# Patient Record
Sex: Male | Born: 1995 | Race: Black or African American | Hispanic: No | Marital: Single | Smoking: Current every day smoker
Health system: Southern US, Community
[De-identification: ages and names within clinical notes are randomized; demographics above are authoritative.]

---
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
## 2014-03-21 ENCOUNTER — Emergency Department: Payer: Self-pay | Admitting: Student

## 2014-08-01 ENCOUNTER — Emergency Department: Admit: 2014-08-01 | Disposition: A | Discharge status: Home / Self Care | Payer: Self-pay | Admitting: Student

## 2014-08-22 ENCOUNTER — Emergency Department
Admit: 2014-08-22 | Disposition: A | Discharge status: Home / Self Care | Payer: Self-pay | Admitting: Emergency Medicine

## 2017-03-14 ENCOUNTER — Emergency Department
Admission: EM | Admit: 2017-03-14 | Discharge: 2017-03-14 | Disposition: A | Discharge status: Other Acute Inpatient Hospital | Payer: Commercial Managed Care - PPO | Attending: Student in an Organized Health Care Education/Training Program | Admitting: Student in an Organized Health Care Education/Training Program

## 2017-03-14 ENCOUNTER — Encounter: Payer: Self-pay | Admitting: Emergency Medicine

## 2017-03-14 ENCOUNTER — Emergency Department: Payer: Commercial Managed Care - PPO

## 2017-03-14 DIAGNOSIS — Z23 Encounter for immunization: Secondary | ICD-10-CM | POA: Insufficient documentation

## 2017-03-14 DIAGNOSIS — S66902A Unspecified injury of unspecified muscle, fascia and tendon at wrist and hand level, left hand, initial encounter: Secondary | ICD-10-CM | POA: Insufficient documentation

## 2017-03-14 DIAGNOSIS — S61402A Unspecified open wound of left hand, initial encounter: Secondary | ICD-10-CM | POA: Insufficient documentation

## 2017-03-14 DIAGNOSIS — R079 Chest pain, unspecified: Secondary | ICD-10-CM | POA: Diagnosis not present

## 2017-03-14 DIAGNOSIS — Y998 Other external cause status: Secondary | ICD-10-CM | POA: Insufficient documentation

## 2017-03-14 DIAGNOSIS — S3991XA Unspecified injury of abdomen, initial encounter: Secondary | ICD-10-CM | POA: Insufficient documentation

## 2017-03-14 DIAGNOSIS — F17210 Nicotine dependence, cigarettes, uncomplicated: Secondary | ICD-10-CM | POA: Diagnosis not present

## 2017-03-14 DIAGNOSIS — Y939 Activity, unspecified: Secondary | ICD-10-CM | POA: Diagnosis not present

## 2017-03-14 DIAGNOSIS — R55 Syncope and collapse: Secondary | ICD-10-CM | POA: Insufficient documentation

## 2017-03-14 DIAGNOSIS — S62323B Displaced fracture of shaft of third metacarpal bone, left hand, initial encounter for open fracture: Secondary | ICD-10-CM | POA: Diagnosis not present

## 2017-03-14 DIAGNOSIS — Y92410 Unspecified street and highway as the place of occurrence of the external cause: Secondary | ICD-10-CM | POA: Diagnosis not present

## 2017-03-14 DIAGNOSIS — R000 Tachycardia, unspecified: Secondary | ICD-10-CM | POA: Insufficient documentation

## 2017-03-14 DIAGNOSIS — S0993XA Unspecified injury of face, initial encounter: Secondary | ICD-10-CM | POA: Diagnosis present

## 2017-03-14 LAB — CBC WITH DIFFERENTIAL/PLATELET
BASOS ABS: 0 10*3/uL (ref 0–0.1)
Basophils Relative: 0 %
EOS PCT: 0 %
Eosinophils Absolute: 0 10*3/uL (ref 0–0.7)
HEMATOCRIT: 42.2 % (ref 40.0–52.0)
Hemoglobin: 13.2 g/dL (ref 13.0–18.0)
LYMPHS PCT: 5 %
Lymphs Abs: 0.7 10*3/uL — ABNORMAL LOW (ref 1.0–3.6)
MCH: 22.5 pg — ABNORMAL LOW (ref 26.0–34.0)
MCHC: 31.2 g/dL — ABNORMAL LOW (ref 32.0–36.0)
MCV: 72.1 fL — AB (ref 80.0–100.0)
Monocytes Absolute: 1.1 10*3/uL — ABNORMAL HIGH (ref 0.2–1.0)
Monocytes Relative: 8 %
NEUTROS ABS: 12.5 10*3/uL — AB (ref 1.4–6.5)
Neutrophils Relative %: 87 %
PLATELETS: 223 10*3/uL (ref 150–440)
RBC: 5.84 MIL/uL (ref 4.40–5.90)
RDW: 15.4 % — ABNORMAL HIGH (ref 11.5–14.5)
WBC: 14.3 10*3/uL — AB (ref 3.8–10.6)

## 2017-03-14 LAB — COMPREHENSIVE METABOLIC PANEL
ALT: 23 U/L (ref 17–63)
AST: 33 U/L (ref 15–41)
Albumin: 4.3 g/dL (ref 3.5–5.0)
Alkaline Phosphatase: 53 U/L (ref 38–126)
Anion gap: 9 (ref 5–15)
BUN: 11 mg/dL (ref 6–20)
CALCIUM: 8.6 mg/dL — AB (ref 8.9–10.3)
CHLORIDE: 104 mmol/L (ref 101–111)
CO2: 24 mmol/L (ref 22–32)
CREATININE: 0.86 mg/dL (ref 0.61–1.24)
GFR calc Af Amer: >60 mL/min (ref >60)
Glucose, Bld: 100 mg/dL — ABNORMAL HIGH (ref 65–99)
Potassium: 3.6 mmol/L (ref 3.5–5.1)
Sodium: 137 mmol/L (ref 135–145)
TOTAL PROTEIN: 7 g/dL (ref 6.5–8.1)
Total Bilirubin: 0.5 mg/dL (ref 0.3–1.2)

## 2017-03-14 LAB — LIPASE, BLOOD: Lipase: 20 U/L (ref 11–51)

## 2017-03-14 LAB — TYPE AND SCREEN
ABO/RH(D): O POS
Antibody Screen: NEGATIVE

## 2017-03-14 MED ORDER — CEFAZOLIN SODIUM-DEXTROSE 1-4 GM/50ML-% IV SOLN
1.0000 g | Freq: Once | INTRAVENOUS | Status: AC
  Administered 2017-03-14: 1 g via INTRAVENOUS

## 2017-03-14 MED ORDER — TETANUS-DIPHTH-ACELL PERTUSSIS 5-2.5-18.5 LF-MCG/0.5 IM SUSP
0.5000 mL | Freq: Once | INTRAMUSCULAR | Status: AC
  Administered 2017-03-14: 0.5 mL via INTRAMUSCULAR
  Filled 2017-03-14: qty 0.5

## 2017-03-14 MED ORDER — CEFAZOLIN SODIUM 1 G IJ SOLR
INTRAMUSCULAR | Status: DC
Start: 2017-03-14 — End: 2017-03-14
  Filled 2017-03-14: qty 10

## 2017-03-14 MED ORDER — IOPAMIDOL (ISOVUE-300) INJECTION 61%
100.0000 mL | Freq: Once | INTRAVENOUS | Status: AC | PRN
  Administered 2017-03-14: 100 mL via INTRAVENOUS

## 2017-03-14 MED ORDER — SODIUM CHLORIDE 0.9 % IV BOLUS (SEPSIS)
1000.0000 mL | Freq: Once | INTRAVENOUS | Status: AC
  Administered 2017-03-14: 1000 mL via INTRAVENOUS

## 2017-03-14 MED ORDER — FENTANYL CITRATE (PF) 100 MCG/2ML IJ SOLN
100.0000 ug | INTRAMUSCULAR | Status: DC | PRN
  Administered 2017-03-14: 100 ug via INTRAVENOUS
  Filled 2017-03-14: qty 2
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
## 2017-03-14 NOTE — ED Notes (Signed)
Hand splinted by Tammy per dr orders.
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
## 2017-03-14 NOTE — ED Notes (Signed)
Gave report to christy from [facility]
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
## 2021-12-25 ENCOUNTER — Ambulatory Visit: Payer: Self-pay

## 2021-12-28 ENCOUNTER — Ambulatory Visit: Payer: Self-pay

## 2022-02-16 ENCOUNTER — Emergency Department: Payer: No Typology Code available for payment source

## 2022-02-16 ENCOUNTER — Other Ambulatory Visit: Payer: Self-pay

## 2022-02-16 DIAGNOSIS — R079 Chest pain, unspecified: Secondary | ICD-10-CM | POA: Diagnosis not present

## 2022-02-16 DIAGNOSIS — S62101A Fracture of unspecified carpal bone, right wrist, initial encounter for closed fracture: Secondary | ICD-10-CM | POA: Diagnosis not present

## 2022-02-16 DIAGNOSIS — Y92410 Unspecified street and highway as the place of occurrence of the external cause: Secondary | ICD-10-CM | POA: Diagnosis not present

## 2022-02-16 DIAGNOSIS — M25512 Pain in left shoulder: Secondary | ICD-10-CM | POA: Insufficient documentation

## 2022-02-16 DIAGNOSIS — S6991XA Unspecified injury of right wrist, hand and finger(s), initial encounter: Secondary | ICD-10-CM | POA: Diagnosis present
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
## 2022-02-17 ENCOUNTER — Emergency Department
Admission: EM | Admit: 2022-02-17 | Discharge: 2022-02-17 | Disposition: A | Discharge status: Home / Self Care | Payer: No Typology Code available for payment source | Attending: Emergency Medicine | Admitting: Emergency Medicine

## 2022-02-17 DIAGNOSIS — S62101A Fracture of unspecified carpal bone, right wrist, initial encounter for closed fracture: Secondary | ICD-10-CM

## 2022-02-17 MED ORDER — OXYCODONE HCL 5 MG PO TABS
5.0000 mg | ORAL_TABLET | Freq: Once | ORAL | Status: AC
  Administered 2022-02-17: 5 mg via ORAL
  Filled 2022-02-17: qty 1

## 2022-02-17 MED ORDER — OXYCODONE HCL 5 MG PO TABS: 5.0000 mg | ORAL_TABLET | Freq: Three times a day (TID) | ORAL | 0 refills | Status: AC | PRN

## 2022-02-17 MED ORDER — ACETAMINOPHEN 500 MG PO TABS
1000.0000 mg | ORAL_TABLET | Freq: Once | ORAL | Status: AC
  Administered 2022-02-17: 1000 mg via ORAL
  Filled 2022-02-17: qty 2

## 2022-02-17 MED ORDER — IBUPROFEN 600 MG PO TABS
600.0000 mg | ORAL_TABLET | Freq: Once | ORAL | Status: AC
  Administered 2022-02-17: 600 mg via ORAL
  Filled 2022-02-17: qty 1
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
## 2022-02-17 NOTE — ED Notes (Signed)
Patient discharged at this time. Ambulated to lobby with independent and steady gait. Breathing unlabored speaking in full sentences. Verbalized understanding of all discharge, follow up, and medication teaching. Discharged homed with all belongings.   

## 2022-02-17 NOTE — Discharge Instructions (Addendum)
You fractured a "sesamoid bone" at the base of your thumb.  This is like an accessory bone that helps act as a pulley for ligaments to go across to help your joints move.  It is not the actual hand or thumb bone itself, it is a small adjacent bone by the joint where your thumb meets your hand.  Use ice, elevation, compression with the brace.  Follow up with the orthopedic doctor in the clinic  Please take Tylenol and ibuprofen/Advil for your pain.  It is safe to take them together, or to alternate them every few hours.  Take up to 1000mg  of Tylenol at a time, up to 4 times per day.  Do not take more than 4000 mg of Tylenol in 24 hours.  For ibuprofen, take 400-600 mg, 3 - 4 times per day.  Use the oxycodone as needed for more severe/breakthrough pain.

## 2022-02-19 ENCOUNTER — Emergency Department
Admission: EM | Admit: 2022-02-19 | Discharge: 2022-02-19 | Discharge status: Against Medical Advice | Payer: Commercial Managed Care - PPO

## 2023-04-27 ENCOUNTER — Encounter (HOSPITAL_BASED_OUTPATIENT_CLINIC_OR_DEPARTMENT_OTHER): Payer: Self-pay | Admitting: Emergency Medicine

## 2023-04-27 ENCOUNTER — Other Ambulatory Visit: Payer: Self-pay

## 2023-04-27 ENCOUNTER — Emergency Department (HOSPITAL_BASED_OUTPATIENT_CLINIC_OR_DEPARTMENT_OTHER)
Admission: EM | Admit: 2023-04-27 | Discharge: 2023-04-27 | Disposition: A | Discharge status: Home / Self Care | Payer: Self-pay

## 2023-04-27 ENCOUNTER — Emergency Department (HOSPITAL_BASED_OUTPATIENT_CLINIC_OR_DEPARTMENT_OTHER): Payer: Self-pay | Admitting: Radiology

## 2023-04-27 DIAGNOSIS — T148XXA Other injury of unspecified body region, initial encounter: Secondary | ICD-10-CM

## 2023-04-27 DIAGNOSIS — X501XXA Overexertion from prolonged static or awkward postures, initial encounter: Secondary | ICD-10-CM | POA: Insufficient documentation

## 2023-04-27 DIAGNOSIS — Y9367 Activity, basketball: Secondary | ICD-10-CM | POA: Insufficient documentation

## 2023-04-27 DIAGNOSIS — S82891A Other fracture of right lower leg, initial encounter for closed fracture: Secondary | ICD-10-CM | POA: Insufficient documentation

## 2023-04-27 MED ORDER — OXYCODONE-ACETAMINOPHEN 5-325 MG PO TABS
1.0000 | ORAL_TABLET | ORAL | Status: DC | PRN
Start: 2023-04-27 — End: 2023-04-28
  Administered 2023-04-27: 1 via ORAL
  Filled 2023-04-27: qty 1

## 2023-04-27 MED ORDER — HYDROCODONE-ACETAMINOPHEN 5-325 MG PO TABS: 2.0000 | ORAL_TABLET | Freq: Four times a day (QID) | ORAL | 0 refills | Status: AC | PRN

## 2023-04-27 NOTE — ED Triage Notes (Signed)
Twisted right ankle today

## 2023-04-27 NOTE — ED Provider Notes (Signed)
[place] EMERGENCY DEPARTMENT AT [facility] Provider Note   CSN: [id number] Arrival date & time: 04/27/23  1446     History {Add pertinent medical, surgical, social history, OB history to HPI:1} Chief Complaint  Patient presents with   Ankle Pain    LAIKYN DAMAN is a 27 y.o. male.   Ankle Pain      Home Medications Prior to Admission medications   Not on File      Allergies    Patient has no known allergies.    Review of Systems   Review of Systems  Physical Exam Updated Vital Signs BP 132/75 (BP Location: Left Arm)   Pulse 91   Temp 99.2 F (37.3 C)   Resp 18   Wt 61.2 kg   SpO2 100%   BMI 20.53 kg/m  Physical Exam  ED Results / Procedures / Treatments   Labs (all labs ordered are listed, but only abnormal results are displayed) Labs Reviewed - No data to display  EKG None  Radiology DG Ankle Complete Right Result Date: 04/27/2023 CLINICAL DATA:  Twisting injury to the ankle while playing basketball EXAM: RIGHT ANKLE - COMPLETE 3 VIEW COMPARISON:  Right ankle radiograph dated 10/23/2006 FINDINGS: Curvilinear radiodensity projecting over the distal lateral calcaneus. No joint effusion. There is no evidence of arthropathy or other focal bone abnormality. No definite widening of the ankle mortise, noting somewhat suboptimal positioning on mortise view. Soft tissue swelling overlying the lateral hindfoot. IMPRESSION: 1. Curvilinear radiodensity projecting over the distal lateral calcaneus, suspicious for a small avulsion fracture. 2. Soft tissue swelling overlying the lateral hindfoot. Electronically Signed   By: Agustin Cree M.D.   On: 04/27/2023 16:57    Procedures Procedures  {Document cardiac monitor, telemetry assessment procedure when appropriate:1}  Medications Ordered in ED Medications  oxyCODONE-acetaminophen (PERCOCET/ROXICET) 5-325 MG per tablet 1 tablet (1 tablet Oral Given 04/27/23 1509)    ED Course/ Medical Decision  Making/ A&P   {   Click here for ABCD2, HEART and other calculatorsREFRESH Note before signing :1}                              Medical Decision Making Amount and/or Complexity of Data Reviewed Radiology: ordered.  Risk Prescription drug management.     Patient presents to the ED for concern of ***, this involves an extensive number of treatment options, and is a complaint that carries with it a high risk of complications and morbidity.  The differential diagnosis includes ***   Co morbidities that complicate the patient evaluation  ***   Additional history obtained:  Additional history obtained from *** {Blank multiple:19196::"EMS","Family","Nursing","Outside Medical Records","Past Admission"}   External records from outside source obtained and reviewed including ***   Lab Tests:  I Ordered, and personally interpreted labs.  The pertinent results include:  ***   Imaging Studies ordered:  I ordered imaging studies including ***  I independently visualized and interpreted imaging which showed *** I agree with the radiologist interpretation   Cardiac Monitoring:  The patient was maintained on a cardiac monitor.  I personally viewed and interpreted the cardiac monitored which showed an underlying rhythm of: ***   Medicines ordered and prescription drug management:  I ordered medication including ***  for ***  Reevaluation of the patient after these medicines showed that the patient {resolved/improved/worsened:23923::"improved"} I have reviewed the patients home medicines and have made adjustments as needed  Test Considered:  ***   Critical Interventions:  ***   Consultations Obtained:  I requested consultation with ortho Angelica Ran),  and discussed lab and imaging findings as well as pertinent plan - they recommend: CAM boot and outpatient f/u in 1-2 weeks   Problem List / ED Course:  ***   Reevaluation:  After the interventions noted  above, I reevaluated the patient and found that they have :{resolved/improved/worsened:23923::"improved"}   Social Determinants of Health:  ***   Dispostion:  After consideration of the diagnostic results and the patients response to treatment, I feel that the patent would benefit from ***.    {Document critical care time when appropriate:1} {Document review of labs and clinical decision tools ie heart score, Chads2Vasc2 etc:1}  {Document your independent review of radiology images, and any outside records:1} {Document your discussion with family members, caretakers, and with consultants:1} {Document social determinants of health affecting pt's care:1} {Document your decision making why or why not admission, treatments were needed:1} Final Clinical Impression(s) / ED Diagnoses Final diagnoses:  None    Rx / DC Orders ED Discharge Orders     None

## 2023-04-27 NOTE — Discharge Instructions (Addendum)
You for letting us evaluate you today.  We gave you 1 Percocet here in the emergency department for pain.  I have also sent Norco to your pharmacy to take as needed for pain.  I have provided you with orthopedic follow-up above.  Please make sure to make an appointment soon as possible or within the next 1 to 2 weeks.  You may use Voltaren gel, IcyHot, lidocaine patches found over-the-counter at your pharmacy for pain.  Please make sure to elevate foot as often as possible and use ice to decrease swelling

## 2023-04-27 NOTE — ED Provider Notes (Incomplete)
Playing bb and going for lay up and someone was trying to block hi Tried to turn sideways and heard a pop and believes he twisted ankle. NWB. Pain with dorsiflexion. Swelling right lateral and dorsal areas TTP of dorsal, lat mellous, inferior to medial malleous

## 2023-08-09 ENCOUNTER — Ambulatory Visit: Payer: Self-pay

## 2023-08-09 ENCOUNTER — Encounter: Payer: Self-pay | Admitting: Family Medicine

## 2023-08-09 ENCOUNTER — Ambulatory Visit: Payer: Self-pay | Admitting: Family Medicine

## 2023-08-09 DIAGNOSIS — Z113 Encounter for screening for infections with a predominantly sexual mode of transmission: Secondary | ICD-10-CM

## 2023-08-09 LAB — HM HIV SCREENING LAB: HM HIV Screening: NEGATIVE

## 2023-08-09 LAB — HEPATITIS B SURFACE ANTIGEN

## 2023-08-09 LAB — HM HEPATITIS C SCREENING LAB: HM Hepatitis Screen: NEGATIVE

## 2023-08-09 NOTE — Progress Notes (Signed)
 [facility] Department STI clinic [address] [place] [address] Main phone: [phone]  STI screening visit  Subjective:  Jeremiah Barron is a 28 y.o. male being seen today for an STI screening visit. The patient reports they do not have symptoms.    Patient has the following medical conditions:  There are no active problems to display for this patient.   Chief Complaint  Patient presents with   STI SCREEN    HPI HPI Patient reports to clinic for STI testing- asymptomatic  STI screening history: Last HIV test per patient/review of record was No results found for: "HMHIVSCREEN" No results found for: "HIV"  Last HEPC test per patient/review of record was No results found for: "HMHEPCSCREEN" No components found for: "HEPC"   Last HEPB test per patient/review of record was No components found for: "HMHEPBSCREEN"   Fertility: Does the patient or their partner desires a pregnancy in the next year? No  Screening for MPX risk: Does the patient have an unexplained rash? No Is the patient MSM? No Does the patient endorse multiple sex partners or anonymous sex partners? No Did the patient have close or sexual contact with a person diagnosed with MPX? No Has the patient traveled outside the [place] where MPX is endemic? No Is there a high clinical suspicion for MPX-- evidenced by one of the following No  -Unlikely to be chickenpox  -Lymphadenopathy  -Rash that present in same phase of evolution on any given body part   See flowsheet for further details and programmatic requirements.   Immunization History  Administered Date(s) Administered   Tdap 03/14/2017     The following portions of the patient's history were reviewed and updated as appropriate: allergies, current medications, past medical history, past social history, past surgical history and problem list.  Objective:  There were no vitals filed for this visit.  Physical  Exam Vitals and nursing note reviewed.  Constitutional:      Appearance: Normal appearance.  HENT:     Head: Normocephalic and atraumatic.     Mouth/Throat:     Mouth: Mucous membranes are moist.     Pharynx: No oropharyngeal exudate or posterior oropharyngeal erythema.  Eyes:     General:        Right eye: No discharge.        Left eye: No discharge.     Conjunctiva/sclera:     Right eye: Right conjunctiva is not injected. No exudate.    Left eye: Left conjunctiva is not injected. No exudate. Pulmonary:     Effort: Pulmonary effort is normal.  Abdominal:     General: Abdomen is flat.     Palpations: Abdomen is soft. There is no hepatomegaly or mass.     Tenderness: There is no abdominal tenderness. There is no rebound.  Genitourinary:    Comments: Declined genital exam- asymptomatic Lymphadenopathy:     Cervical: No cervical adenopathy.     Upper Body:     Right upper body: No supraclavicular or axillary adenopathy.     Left upper body: No supraclavicular or axillary adenopathy.  Skin:    General: Skin is warm and dry.  Neurological:     Mental Status: He is alert and oriented to person, place, and time.     Assessment and Plan:  Jeremiah Barron is a 28 y.o. male presenting to the [facility] Department for STI screening  1. Screening for venereal disease (Primary)  -  Chlamydia/GC NAA, Confirmation - Syphilis Serology, [facility] - HIV/HCV [facility] - HBV Antigen/Antibody State Lab   Patient does not have STI symptoms Patient accepted all screenings including  urine GC/Chlamydia, and blood work for HIV/Syphilis. Patient meets criteria for HepB screening? Yes. Ordered? yes Patient meets criteria for HepC screening? Yes. Ordered? yes Recommended condom use with all sex Discussed importance of condom use for STI prevention  Treat positive test results per standing order. Discussed time line for State Lab results and that patient will be called with  positive results and encouraged patient to call if he had not heard in 2 weeks Recommended repeat testing in 3 months with positive results. Recommended returning for continued or worsening symptoms.   Return if symptoms worsen or fail to improve, for STI screening.  No future appointments.  Lenice Llamas, [place]

## 2023-08-13 LAB — CHLAMYDIA/GC NAA, CONFIRMATION
Chlamydia trachomatis, NAA: NEGATIVE
Neisseria gonorrhoeae, NAA: NEGATIVE

## 2023-10-01 ENCOUNTER — Encounter (HOSPITAL_COMMUNITY): Payer: Self-pay

## 2023-10-01 ENCOUNTER — Other Ambulatory Visit: Payer: Self-pay

## 2023-10-01 ENCOUNTER — Emergency Department (HOSPITAL_COMMUNITY)
Admission: EM | Admit: 2023-10-01 | Discharge: 2023-10-01 | Disposition: A | Discharge status: Home / Self Care | Payer: Self-pay | Attending: Emergency Medicine | Admitting: Emergency Medicine

## 2023-10-01 DIAGNOSIS — Z202 Contact with and (suspected) exposure to infections with a predominantly sexual mode of transmission: Secondary | ICD-10-CM | POA: Insufficient documentation

## 2023-10-01 LAB — URINALYSIS, ROUTINE W REFLEX MICROSCOPIC
Bilirubin Urine: NEGATIVE
Glucose, UA: NEGATIVE mg/dL
Hgb urine dipstick: NEGATIVE
Ketones, ur: NEGATIVE mg/dL
Leukocytes,Ua: NEGATIVE
Nitrite: NEGATIVE
Protein, ur: NEGATIVE mg/dL
Specific Gravity, Urine: 1.03 (ref 1.005–1.030)
pH: 5 (ref 5.0–8.0)

## 2023-10-01 LAB — HIV ANTIBODY (ROUTINE TESTING W REFLEX): HIV Screen 4th Generation wRfx: NONREACTIVE

## 2023-10-01 NOTE — Discharge Instructions (Signed)
 As we discussed, viral testing results will take several days to result and you will be contacted by a nurse navigator with results should they become abnormal.  Please ensure that they have Updated contact information so that he if needed you can be updated with this information.  Otherwise recommend that you follow-up with primary care in the next week.

## 2023-10-01 NOTE — ED Provider Notes (Signed)
 [place] EMERGENCY DEPARTMENT AT [facility] Provider Note   CSN: [id number] Arrival date & time: 10/01/23  0214     History  Chief Complaint  Patient presents with   Exposure to STD    Jeremiah Barron is a 28 y.o. male who presents with concerns for HSV infection as his sexual partner advised that she had chronic HSV infection and did not inform him prior to sexual activity.  He presents to the ED today of concern for desire to have full STD panel performed.  He does not have any symptoms, no dysuria, no penile discharge, no pelvic pain.  Further does not have any nausea/vomiting.   Exposure to STD       Home Medications Prior to Admission medications   Not on File      Allergies    Patient has no known allergies.    Review of Systems   Review of Systems  Genitourinary:  Negative for difficulty urinating, dysuria, flank pain, genital sores, penile discharge and penile pain.  All other systems reviewed and are negative.   Physical Exam Updated Vital Signs BP 108/85   Pulse 68   Temp (!) 97.3 F (36.3 C) (Oral)   Resp 16   SpO2 100%  Physical Exam Vitals and nursing note reviewed.  HENT:     Head: Normocephalic and atraumatic.  Eyes:     General: No scleral icterus.    Conjunctiva/sclera: Conjunctivae normal.  Pulmonary:     Effort: Pulmonary effort is normal.  Abdominal:     General: Abdomen is flat.  Musculoskeletal:     Cervical back: Normal range of motion and neck supple.  Skin:    Findings: No rash.  Neurological:     Mental Status: He is alert.  Psychiatric:        Mood and Affect: Mood normal.     ED Results / Procedures / Treatments   Labs (all labs ordered are listed, but only abnormal results are displayed) Labs Reviewed - No data to display  EKG None  Radiology No results found.  Procedures Procedures    Medications Ordered in ED Medications - No data to display  ED Course/ Medical Decision Making/  A&P Clinical Course as of 10/01/23 0940  Tue Oct 01, 2023  0906 GC/Chlamydia probe amp [JG]  4098 Hsv Culture And Typing [JG]    Clinical Course User Index [JG] Juanetta Nordmann, PA                                 Medical Decision Making Amount and/or Complexity of Data Reviewed Labs: ordered. Decision-making details documented in ED Course.   Medical Decision Making:   Jeremiah Barron is a 28 y.o. male who presented to the ED today with concerns for HSV testing.  Detailed above.     Complete initial physical exam performed, notably the patient  was alert and oriented in no apparent distress, he is asymptomatic, physical exam is completely unremarkable..    Reviewed and confirmed nursing documentation for past medical history, family history, social history.    Initial Assessment:   With the patient's presentation of presentation for STI testing, most likely diagnosis is potential STI infection.   Initial Plan:  Provide STI panel as noted. As patient is asymptomatic this time, will not provide antibiotic treatment at this time. Objective evaluation as below reviewed   Initial Study Results:  Laboratory  All laboratory results reviewed without evidence of clinically relevant pathology.   Exceptions include: Awaiting results at time of discharge.  Reassessment and Plan:   As discussed with the patient, as he is asymptomatic at this time and is primarily concerned for HSV infection, these results will take several days and he will be contacted by nurse navigator should abnormal results show.  Otherwise suggest he follow-up with primary care for further workup and management.          Final Clinical Impression(s) / ED Diagnoses Final diagnoses:  None    Rx / DC Orders ED Discharge Orders     None         Juanetta Nordmann, PA 10/01/23 0941    Lind Repine, MD 10/03/23 [phone]

## 2023-10-01 NOTE — ED Triage Notes (Signed)
 Pt requesting STD check. Pt reports his partner has HSV.

## 2023-10-02 LAB — RPR: RPR Ser Ql: NONREACTIVE

## 2023-10-02 LAB — GC/CHLAMYDIA PROBE AMP (~~LOC~~) NOT AT ARMC
Chlamydia: NEGATIVE
Comment: NEGATIVE
Comment: NORMAL
Neisseria Gonorrhea: NEGATIVE

## 2024-02-14 ENCOUNTER — Encounter: Payer: Self-pay | Admitting: Emergency Medicine

## 2024-02-14 ENCOUNTER — Emergency Department: Payer: Self-pay

## 2024-02-14 ENCOUNTER — Emergency Department
Admission: EM | Admit: 2024-02-14 | Discharge: 2024-02-14 | Disposition: A | Discharge status: Home / Self Care | Payer: Self-pay | Attending: Emergency Medicine | Admitting: Emergency Medicine

## 2024-02-14 ENCOUNTER — Other Ambulatory Visit: Payer: Self-pay

## 2024-02-14 DIAGNOSIS — S62619A Displaced fracture of proximal phalanx of unspecified finger, initial encounter for closed fracture: Secondary | ICD-10-CM

## 2024-02-14 DIAGNOSIS — X500XXA Overexertion from strenuous movement or load, initial encounter: Secondary | ICD-10-CM | POA: Diagnosis not present

## 2024-02-14 DIAGNOSIS — S4992XA Unspecified injury of left shoulder and upper arm, initial encounter: Secondary | ICD-10-CM | POA: Diagnosis present

## 2024-02-14 DIAGNOSIS — S42352A Displaced comminuted fracture of shaft of humerus, left arm, initial encounter for closed fracture: Secondary | ICD-10-CM | POA: Diagnosis not present

## 2024-02-14 DIAGNOSIS — S62616A Displaced fracture of proximal phalanx of right little finger, initial encounter for closed fracture: Secondary | ICD-10-CM | POA: Diagnosis not present

## 2024-02-14 MED ORDER — OXYCODONE HCL 5 MG PO TABS
5.0000 mg | ORAL_TABLET | Freq: Once | ORAL | Status: AC
  Administered 2024-02-14: 5 mg via ORAL
  Filled 2024-02-14: qty 1

## 2024-02-14 MED ORDER — IBUPROFEN 800 MG PO TABS
800.0000 mg | ORAL_TABLET | Freq: Once | ORAL | Status: AC
  Administered 2024-02-14: 800 mg via ORAL
  Filled 2024-02-14: qty 1

## 2024-02-14 MED ORDER — OXYCODONE HCL 5 MG PO TABS: 5.0000 mg | ORAL_TABLET | Freq: Three times a day (TID) | ORAL | 0 refills | Status: AC | PRN
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
## 2024-02-14 NOTE — ED Triage Notes (Signed)
 Patient to ED via [facility] under custody of [place] [employer] for left arm pain after being arrested. Patient non cooperative when asked any other triage questions. Patient only requesting pain meds and stating omg, they messed my arm up man.

## 2024-02-14 NOTE — ED Notes (Signed)
 See triage note  Presents with [employer] in custody  States he raised his arms  Felt a pop' to left shoulder Pt is loud with officers
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
## 2024-02-14 NOTE — Discharge Instructions (Addendum)
 You were seen in the ED for a left arm injury and right hand injury.  Your x-ray shows a humeral shaft (arm) fracture and proximal phalanx (finger) fracture.  You will need to follow-up with orthopedics in 1 week for further management and discussion of surgical intervention.  In the interim please remain in sling.  Do not remove temporary cast that has been placed until this follow-up appointment.  Call and schedule appointment with Dr. Edie, the orthopedic surgeons stated above, today to make an appointment in 1 week.  A short course of pain medication has been sent to your pharmacy.  If you are unable to access this medication you can try alternating Tylenol  and ibuprofen  for pain as needed.  Pain control:  Ibuprofen  (motrin /aleve/advil ) - You can take 3 tablets (600 mg) every 6 hours as needed for pain/fever.  Acetaminophen  (tylenol ) - You can take 2 extra strength tablets (1000 mg) every 6 hours as needed for pain/fever.  You can alternate these medications or take them together.  Make sure you eat food/drink water when taking these medications.  For any new or worsening symptoms return to ED for further evaluation.
